# Patient Record
Sex: Male | Born: 1990 | Race: Black or African American | Hispanic: No | Marital: Single | Smoking: Never smoker
Health system: Southern US, Community
[De-identification: ages and names within clinical notes are randomized; demographics above are authoritative.]

## PROBLEM LIST (undated history)

## (undated) HISTORY — PX: ANKLE SURGERY: SHX546

---

## 2009-03-10 ENCOUNTER — Ambulatory Visit: Payer: Self-pay | Admitting: Otolaryngology

## 2014-03-07 ENCOUNTER — Ambulatory Visit: Payer: Self-pay

## 2014-03-07 ENCOUNTER — Ambulatory Visit: Payer: Self-pay | Admitting: Registered Nurse

## 2014-03-22 ENCOUNTER — Ambulatory Visit: Payer: Self-pay | Admitting: Family Medicine

## 2014-10-03 ENCOUNTER — Ambulatory Visit
Admission: EM | Admit: 2014-10-03 | Discharge: 2014-10-03 | Discharge status: Absent without leave | Payer: Managed Care, Other (non HMO)

## 2015-01-21 ENCOUNTER — Ambulatory Visit
Admission: EM | Admit: 2015-01-21 | Discharge: 2015-01-21 | Disposition: A | Discharge status: Home / Self Care | Payer: BLUE CROSS/BLUE SHIELD | Attending: Family Medicine | Admitting: Family Medicine

## 2015-01-21 ENCOUNTER — Ambulatory Visit (INDEPENDENT_AMBULATORY_CARE_PROVIDER_SITE_OTHER): Payer: BLUE CROSS/BLUE SHIELD

## 2015-01-21 ENCOUNTER — Encounter: Payer: Self-pay | Admitting: Emergency Medicine

## 2015-01-21 DIAGNOSIS — S93401A Sprain of unspecified ligament of right ankle, initial encounter: Secondary | ICD-10-CM

## 2015-01-21 DIAGNOSIS — S93601A Unspecified sprain of right foot, initial encounter: Secondary | ICD-10-CM | POA: Diagnosis not present

## 2015-01-21 MED ORDER — MELOXICAM 15 MG PO TABS: 15.0000 mg | ORAL_TABLET | Freq: Every day | ORAL | Status: DC

## 2015-01-21 NOTE — ED Notes (Signed)
Patient states that he was in a basketball game yesterday and another player landed on his right foot.  Patient c/o pain across the top of his right foot.

## 2015-01-21 NOTE — Discharge Instructions (Signed)
Foot Sprain °A foot sprain is an injury to one of the strong bands of tissue (ligaments) that connect and support the many bones in your feet. The ligament can be stretched too much or it can tear. A tear can be either partial or complete. The severity of the sprain depends on how much of the ligament was damaged or torn. °CAUSES °A foot sprain is usually caused by suddenly twisting or pivoting your foot. °RISK FACTORS °This injury is more likely to occur in people who: °· Play a sport, such as basketball or football. °· Exercise or play a sport without warming up. °· Start a new workout or sport. °· Suddenly increase how long or hard they exercise or play a sport. °SYMPTOMS °Symptoms of this condition start soon after an injury and include: °· Pain, especially in the arch of the foot. °· Bruising. °· Swelling. °· Inability to walk or use the foot to support body weight. °DIAGNOSIS °This condition is diagnosed with a medical history and physical exam. You may also have imaging tests, such as: °· X-rays to make sure there are no broken bones (fractures). °· MRI to see if the ligament has torn. °TREATMENT °Treatment varies depending on the severity of your sprain. Mild sprains can be treated with rest, ice, compression, and elevation (RICE). If your ligament is overstretched or partially torn, treatment usually involves keeping your foot in a fixed position (immobilization) for a period of time. To help you do this, your health care provider will apply a bandage, splint, or walking boot to keep your foot from moving until it heals. You may also be advised to use crutches or a scooter for a few weeks to avoid bearing weight on your foot while it is healing. °If your ligament is fully torn, you may need surgery to reconnect the ligament to the bone. After surgery, a cast or splint will be applied and will need to stay on your foot while it heals. °Your health care provider may also suggest exercises or physical therapy  to strengthen your foot. °HOME CARE INSTRUCTIONS °If You Have a Bandage, Splint, or Walking Boot: °· Wear it as directed by your health care provider. Remove it only as directed by your health care provider. °· Loosen the bandage, splint, or walking boot if your toes become numb and tingle, or if they turn cold and blue. °Bathing °· If your health care provider approves bathing and showering, cover the bandage or splint with a watertight plastic bag to protect it from water. Do not let the bandage or splint get wet. °Managing Pain, Stiffness, and Swelling  °· If directed, apply ice to the injured area: °¨ Put ice in a plastic bag. °¨ Place a towel between your skin and the bag. °¨ Leave the ice on for 20 minutes, 2-3 times per day. °· Move your toes often to avoid stiffness and to lessen swelling. °· Raise (elevate) the injured area above the level of your heart while you are sitting or lying down. °Driving °· Do not drive or operate heavy machinery while taking pain medicine. °· Do not drive while wearing a bandage, splint, or walking boot on a foot that you use for driving. °Activity °· Rest as directed by your health care provider. °· Do not use the injured foot to support your body weight until your health care provider says that you can. Use crutches or other supportive devices as directed by your health care provider. °· Ask your health care   provider what activities are safe for you. Gradually increase how much and how far you walk until your health care provider says it is safe to return to full activity.  Do any exercise or physical therapy as directed by your health care provider. General Instructions  If a splint was applied, do not put pressure on any part of it until it is fully hardened. This may take several hours.  Take medicines only as directed by your health care provider. These include over-the-counter medicines and prescription medicines.  Keep all follow-up visits as directed by your  health care provider. This is important.  When you can walk without pain, wear supportive shoes that have stiff soles. Do not wear flip-flops, and do not walk barefoot. SEEK MEDICAL CARE IF:  Your pain is not controlled with medicine.  Your bruising or swelling gets worse or does not get better with treatment.  Your splint or walking boot is damaged. SEEK IMMEDIATE MEDICAL CARE IF:  Your foot is numb or blue.  Your foot feels colder than normal.   This information is not intended to replace advice given to you by your health care provider. Make sure you discuss any questions you have with your health care provider.   Document Released: 06/23/2001 Document Revised: 05/18/2014 Document Reviewed: 11/04/2013 Elsevier Interactive Patient Education 2016 [employer]  Cryotherapy Cryotherapy is when you put ice on your injury. Ice helps lessen pain and puffiness (swelling) after an injury. Ice works the best when you start using it in the first 24 to 48 hours after an injury. HOME CARE  Put a dry or damp towel between the ice pack and your skin.  You may press gently on the ice pack.  Leave the ice on for no more than 10 to 20 minutes at a time.  Check your skin after 5 minutes to make sure your skin is okay.  Rest at least 20 minutes between ice pack uses.  Stop using ice when your skin loses feeling (numbness).  Do not use ice on someone who cannot tell you when it hurts. This includes small children and people with memory problems (dementia). GET HELP RIGHT AWAY IF:  You have white spots on your skin.  Your skin turns blue or pale.  Your skin feels waxy or hard.  Your puffiness gets worse. MAKE SURE YOU:   Understand these instructions.  Will watch your condition.  Will get help right away if you are not doing well or get worse.   This information is not intended to replace advice given to you by your health care provider. Make sure you discuss any questions you  have with your health care provider.   Document Released: 06/20/2007 Document Revised: 03/26/2011 Document Reviewed: 08/24/2010 Elsevier Interactive Patient Education 2016 [employer] Inc[employer]  Ankle Sprain An ankle sprain is an injury to the strong, fibrous tissues (ligaments) that hold your ankle bones together.  HOME CARE   Put ice on your ankle for 1-2 days or as told by your doctor.  Put ice in a plastic bag.  Place a towel between your skin and the bag.  Leave the ice on for 15-20 minutes at a time, every 2 hours while you are awake.  Only take medicine as told by your doctor.  Raise (elevate) your injured ankle above the level of your heart as much as possible for 2-3 days.  Use crutches if your doctor tells you to. Slowly put your own weight on the affected ankle.  Use the crutches until you can walk without pain.  If you have a plaster splint:  Do not rest it on anything harder than a pillow for 24 hours.  Do not put weight on it.  Do not get it wet.  Take it off to shower or bathe.  If given, use an elastic wrap or support stocking for support. Take the wrap off if your toes lose feeling (numb), tingle, or turn cold or blue.  If you have an air splint:  Add or let out air to make it comfortable.  Take it off at night and to shower and bathe.  Wiggle your toes and move your ankle up and down often while you are wearing it. GET HELP IF:  You have rapidly increasing bruising or puffiness (swelling).  Your toes feel very cold.  You lose feeling in your foot.  Your medicine does not help your pain. GET HELP RIGHT AWAY IF:   Your toes lose feeling (numb) or turn blue.  You have severe pain that is increasing. MAKE SURE YOU:   Understand these instructions.  Will watch your condition.  Will get help right away if you are not doing well or get worse.   This information is not intended to replace advice given to you by your health care provider. Make sure you  discuss any questions you have with your health care provider.   Document Released: 06/20/2007 Document Revised: 01/22/2014 Document Reviewed: 07/16/2011 Elsevier Interactive Patient Education [employer]

## 2015-01-21 NOTE — ED Provider Notes (Signed)
CSN: [id number]     Arrival date & time 01/21/15  0848 History   First MD Initiated Contact with Patient 01/21/15 [phone]    Nurses notes were reviewed. Chief Complaint  Patient presents with  . Foot Pain   She was playing basketball last night when he twists his ankle with down. States it was not too bad last night he was able to walk on it but this morning swelling got worse and he has been unable to walk on it without excruciating pain. He is using crutches at this time. He's had previous injury to the right ankle and college when he is playing football and was hit outbound and wound up having a fracture of his ankle with subsequent pins placed.  Does not smoke no other significant medical surgical history. No significant family medical history  (Consider location/radiation/quality/duration/timing/severity/associated sxs/prior Treatment) Patient is a 25 y.o. male presenting with foot injury. The history is provided by the patient. No language interpreter was used.  Foot Injury Location:  Foot and ankle Time since incident:  1 day Injury: yes   Mechanism of injury: fall   Fall:    Fall occurred:  Recreating/playing   Impact surface:  Athletic surface and playground equipment   Entrapped after fall: no   Ankle location:  R ankle Foot location:  R foot Pain details:    Quality:  Aching, sharp and throbbing   Radiates to:  Does not radiate   Severity:  Moderate   Onset quality:  Sudden   Duration:  1 day   Timing:  Constant   Progression:  Unchanged Chronicity:  New Foreign body present:  No foreign bodies Relieved by:  Nothing Ineffective treatments:  None tried Associated symptoms: stiffness and swelling   Risk factors: no concern for non-accidental trauma, no frequent fractures, no known bone disorder, no obesity and no recent illness     History reviewed. No pertinent past medical history. Past Surgical History  Procedure Laterality Date  . Ankle surgery Right    History  reviewed. No pertinent family history. Social History  Substance Use Topics  . Smoking status: Never Smoker   . Smokeless tobacco: None  . Alcohol Use: No    Review of Systems  Musculoskeletal: Positive for myalgias, joint swelling, gait problem and stiffness.  All other systems reviewed and are negative.   Allergies  Review of patient's allergies indicates no known allergies.  Home Medications   Prior to Admission medications   Medication Sig Start Date End Date Taking? Authorizing Provider  meloxicam (MOBIC) 15 MG tablet Take 1 tablet (15 mg total) by mouth daily. 01/21/15   Hassan Rowan, MD   Meds Ordered and Administered this Visit  Medications - No data to display  BP 122/74 mmHg  Pulse 72  Temp(Src) 97.2 F (36.2 C) (Tympanic)  Resp 16  Ht 6' (1.829 m)  Wt 207 lb (93.895 kg)  BMI 28.07 kg/m2  SpO2 100% No data found.   Physical Exam  Constitutional: He appears well-developed and well-nourished.  HENT:  Head: Normocephalic and atraumatic.  Eyes: Conjunctivae are normal. Pupils are equal, round, and reactive to light.  Musculoskeletal: He exhibits edema and tenderness.       Right ankle: He exhibits swelling. Tenderness.       Right foot: There is tenderness, bony tenderness and swelling.       Feet:  Neurological: He is alert.  Skin: Skin is warm.  Psychiatric: He has a normal mood and affect.  Vitals reviewed.   ED Course  Procedures (including critical care time)  Labs Review Labs Reviewed - No data to display  Imaging Review Dg Foot Complete Right  01/21/2015  CLINICAL DATA:  25 year old male injured right foot in basketball game last night. Pain over the dorsal tarsal bones. Initial encounter. EXAM: RIGHT FOOT COMPLETE - 3+ VIEW COMPARISON:  None. FINDINGS: Cannulated screws traverse the distal right tibia at the level of the plafond and and appear intact. Bone mineralization is within normal limits. Degenerative spurring at the anterior talus and  talonavicular articulation. Overlying soft tissue swelling and stranding in that region. No acute fracture or dislocation identified. Tarsal bone alignment within normal limits. Metatarsals, phalanges, and calcaneus appear intact. IMPRESSION: 1. No acute fracture or dislocation identified about the right foot. 2. Soft tissue swelling overlying degenerative spurring at the talonavicular joint. 3. Distal tibia hardware with no adverse features. Electronically Signed   By: Odessa FlemingH  Hall M.D.   On: 01/21/2015 09:38     Visual Acuity Review  Right Eye Distance:   Left Eye Distance:   Bilateral Distance:    Right Eye Near:   Left Eye Near:    Bilateral Near:         MDM   1. Right ankle sprain, initial encounter   2. Right foot sprain, initial encounter    Patient be placed in a walking boot since x-rays were negative return for follow-up here PCP in 2 weeks if needed. Placed on Mobic 15 mg 1 tablet a day as well    Hassan RowanEugene Lucianne Smestad, MD 01/21/15 1000

## 2016-02-13 ENCOUNTER — Encounter: Payer: Self-pay | Admitting: Emergency Medicine

## 2016-02-13 ENCOUNTER — Emergency Department
Admission: EM | Admit: 2016-02-13 | Discharge: 2016-02-13 | Disposition: A | Discharge status: Home / Self Care | Payer: BLUE CROSS/BLUE SHIELD | Attending: Emergency Medicine | Admitting: Emergency Medicine

## 2016-02-13 DIAGNOSIS — Y929 Unspecified place or not applicable: Secondary | ICD-10-CM | POA: Diagnosis not present

## 2016-02-13 DIAGNOSIS — S0101XA Laceration without foreign body of scalp, initial encounter: Secondary | ICD-10-CM | POA: Insufficient documentation

## 2016-02-13 DIAGNOSIS — S0990XA Unspecified injury of head, initial encounter: Secondary | ICD-10-CM

## 2016-02-13 DIAGNOSIS — W500XXA Accidental hit or strike by another person, initial encounter: Secondary | ICD-10-CM | POA: Diagnosis not present

## 2016-02-13 DIAGNOSIS — Y9367 Activity, basketball: Secondary | ICD-10-CM | POA: Insufficient documentation

## 2016-02-13 DIAGNOSIS — Y999 Unspecified external cause status: Secondary | ICD-10-CM | POA: Insufficient documentation

## 2016-02-13 NOTE — ED Provider Notes (Signed)
[facility] Emergency Department Provider Note  ____________________________________________  Time seen: Approximately 11:27 PM  I have reviewed the triage vital signs and the nursing notes.   HISTORY  Chief Complaint Head Injury and Headache    HPI Maurice Martin is a 26 y.o. male resist emergency department for complaint of head injury. Patient states he was playing basketball when he was accidentally hit in the head by another player's elbow. Patient did not have any loss of consciousness at the time of event or since time and event. Patient does report a small laceration to the left temporal region. Patient reports that he has had a concussion in high school. He denies any visual changes or neck pain. He denies any infusion. His father presented to the emergency department with him and reports that patient has been acting completely normal. Patient does endorse a mild left-sided headache. He has not taken any medications prior to arrival. Patient's last tetanus shot was approximately 8 months prior. No other injury or complaint at this time.   History reviewed. No pertinent past medical history.  There are no active problems to display for this patient.   Past Surgical History:  Procedure Laterality Date  . ANKLE SURGERY Right     Prior to Admission medications   Not on File    Allergies Sulfa antibiotics  History reviewed. No pertinent family history.  Social History Social History  Substance Use Topics  . Smoking status: Never Smoker  . Smokeless tobacco: Never Used  . Alcohol use No     Review of Systems  Constitutional: No fever/chills Eyes: No visual changes. Cardiovascular: no chest pain. Respiratory: no cough. No SOB. Gastrointestinal: No abdominal pain.  No nausea, no vomiting.   Musculoskeletal: Negative for musculoskeletal pain. Skin: Positive for laceration to the left side of the scalp. Neurological: For headache  but denies focal weakness or numbness. 10-point ROS otherwise negative.  ____________________________________________   PHYSICAL EXAM:  VITAL SIGNS: ED Triage Vitals  Enc Vitals Group     BP 02/13/16 2200 138/88     Pulse Rate 02/13/16 2200 76     Resp 02/13/16 2200 16     Temp 02/13/16 2200 98.4 F (36.9 C)     Temp Source 02/13/16 2200 Oral     SpO2 02/13/16 2200 99 %     Weight 02/13/16 2201 219 lb (99.3 kg)     Height 02/13/16 2201 6' (1.829 m)     Head Circumference --      Peak Flow --      Pain Score 02/13/16 2201 5     Pain Loc --      Pain Edu? --      Excl. in GC? --      Constitutional: Alert and oriented. Well appearing and in no acute distress. Eyes: Conjunctivae are normal. PERRL. EOMI. Head: Small, superficial, 1 cm laceration noted to left temporal region. Congealed blood is noted. No wound separation. No active bleeding. No foreign body. Area is only mildly tender to palpation. No palpable abnormality. No raccoon eyes. No battle signs. The serosanguineous fluid drainage from the ears or nares. ENT:      Ears:       Nose: No congestion/rhinnorhea.      Mouth/Throat: Mucous membranes are moist.  Neck: No stridor.  No cervical spine tenderness to palpation.  Cardiovascular: Normal rate, regular rhythm. Normal S1 and S2.  Good peripheral circulation. Respiratory: Normal respiratory effort without tachypnea or retractions.  Lungs CTAB. Good air entry to the bases with no decreased or absent breath sounds. Musculoskeletal: Full range of motion to all extremities. No gross deformities appreciated. Neurologic:  Normal speech and language. No gross focal neurologic deficits are appreciated. Cranial nerves II through XII grossly intact. Skin:  Skin is warm, dry and intact. No rash noted. Psychiatric: Mood and affect are normal. Speech and behavior are normal. Patient exhibits appropriate insight and judgement.   ____________________________________________    LABS (all labs ordered are listed, but only abnormal results are displayed)  Labs Reviewed - No data to display ____________________________________________  EKG   ____________________________________________  RADIOLOGY   No results found.  ____________________________________________    PROCEDURES  Procedure(s) performed:    Procedures    Medications - No data to display   ____________________________________________   INITIAL IMPRESSION / ASSESSMENT AND PLAN / ED COURSE  Pertinent labs & imaging results that were available during my care of the patient were reviewed by me and considered in my medical decision making (see chart for details).  Review of the [place] CSRS was performed in accordance of the [facility] prior to dispensing any controlled drugs.     Patient's diagnosis is consistent with minor head injury with scalp laceration. Patient has no signs of concussion and concussion and neuro exam was completely unremarkable. Patient does have a small superficial laceration to the left side of scalp. After discussing with patient, no treatment is administered. This is shallow and will heal on its own. In my opinion, staples are unnecessary. Patient opts for no staples.. No indication for imaging at this time. Patient take Tylenol and Motrin as needed at home. Wound care instructions are given to patient. Patient will follow-up with primary care as needed. Patient is given ED precautions to return to the ED for any worsening or new symptoms.     ____________________________________________  FINAL CLINICAL IMPRESSION(S) / ED DIAGNOSES  Final diagnoses:  Minor head injury, initial encounter  Scalp laceration, initial encounter      NEW MEDICATIONS STARTED DURING THIS VISIT:  There are no discharge medications for this patient.       This chart was dictated using voice recognition software/Dragon. Despite best efforts to proofread, errors can occur which  can change the meaning. Any change was purely unintentional.    Racheal Patches, PA-C 02/14/16 [id number]    Sharyn Creamer, MD 02/17/16 [phone]

## 2016-02-13 NOTE — ED Triage Notes (Signed)
Pt arrived to the ED accompanied by his father for complaints of headache secondary to a head injury. Pt reports that he was elbowed in the head while playing basket ball. Pt denies LOC and any other accompanied symptoms. Pt is AOx4 in no apparent distress.

## 2016-02-13 NOTE — ED Notes (Signed)

## 2017-10-25 ENCOUNTER — Encounter: Payer: Self-pay | Admitting: Emergency Medicine

## 2017-10-25 ENCOUNTER — Other Ambulatory Visit: Payer: Self-pay

## 2017-10-25 ENCOUNTER — Emergency Department: Payer: No Typology Code available for payment source

## 2017-10-25 ENCOUNTER — Emergency Department
Admission: EM | Admit: 2017-10-25 | Discharge: 2017-10-25 | Disposition: A | Discharge status: Home / Self Care | Payer: No Typology Code available for payment source | Attending: Emergency Medicine | Admitting: Emergency Medicine

## 2017-10-25 DIAGNOSIS — S63502A Unspecified sprain of left wrist, initial encounter: Secondary | ICD-10-CM | POA: Diagnosis not present

## 2017-10-25 DIAGNOSIS — Y929 Unspecified place or not applicable: Secondary | ICD-10-CM | POA: Insufficient documentation

## 2017-10-25 DIAGNOSIS — Y990 Civilian activity done for income or pay: Secondary | ICD-10-CM | POA: Diagnosis not present

## 2017-10-25 DIAGNOSIS — Y9389 Activity, other specified: Secondary | ICD-10-CM | POA: Insufficient documentation

## 2017-10-25 DIAGNOSIS — X500XXA Overexertion from strenuous movement or load, initial encounter: Secondary | ICD-10-CM | POA: Diagnosis not present

## 2017-10-25 DIAGNOSIS — S6992XA Unspecified injury of left wrist, hand and finger(s), initial encounter: Secondary | ICD-10-CM | POA: Diagnosis present

## 2017-10-25 MED ORDER — IBUPROFEN 800 MG PO TABS
800.0000 mg | ORAL_TABLET | Freq: Once | ORAL | Status: AC
  Administered 2017-10-25: 800 mg via ORAL
  Filled 2017-10-25: qty 1

## 2017-10-25 NOTE — ED Notes (Signed)
Patient reports that approx 0100 today patient accidentally dropped 60lb object on left wrist.

## 2017-10-25 NOTE — ED Triage Notes (Signed)
Patient to ER for c/o left wrist injury that occurred while at work. Patient states he picked up something heavy and felt pain. Denies hearing any popping. No obvious deformity noted.

## 2017-10-25 NOTE — ED Notes (Signed)
Xray tech at bedside.

## 2017-10-25 NOTE — ED Provider Notes (Signed)
[facility] Emergency Department Provider Note   ____________________________________________   First MD Initiated Contact with Patient 10/25/17 [phone]     (approximate)  I have reviewed the triage vital signs and the nursing notes.   HISTORY  Chief Complaint Wrist Injury    HPI Maurice Martin is a 27 y.o. male who presents to the ED from work with a chief complaint of left wrist injury.  Patient states he picked up something heavy which bent his wrist backwards and he felt pain.  Denies falling or crush injury.  Patient is right-hand dominant.  Complains of pain to his medial wrist without weakness, numbness or tingling.  Voices no other injuries or complaints.   Past medical history None  There are no active problems to display for this patient.   Past Surgical History:  Procedure Laterality Date  . ANKLE SURGERY Right     Prior to Admission medications   Not on File    Allergies Sulfa antibiotics  No family history on file.  Social History Social History   Tobacco Use  . Smoking status: Never Smoker  . Smokeless tobacco: Never Used  Substance Use Topics  . Alcohol use: No  . Drug use: No    Review of Systems  Constitutional: No fever/chills Eyes: No visual changes. ENT: No sore throat. Cardiovascular: Denies chest pain. Respiratory: Denies shortness of breath. Gastrointestinal: No abdominal pain.  No nausea, no vomiting.  No diarrhea.  No constipation. Genitourinary: Negative for dysuria. Musculoskeletal: Positive for left wrist injury and pain.  Negative for back pain. Skin: Negative for rash. Neurological: Negative for headaches, focal weakness or numbness.   ____________________________________________   PHYSICAL EXAM:  VITAL SIGNS: ED Triage Vitals  Enc Vitals Group     BP 10/25/17 0230 140/65     Pulse Rate 10/25/17 0230 82     Resp 10/25/17 0230 18     Temp 10/25/17 0230 98.2 F (36.8 C)     Temp  Source 10/25/17 0230 Oral     SpO2 10/25/17 0230 100 %     Weight 10/25/17 0231 225 lb (102.1 kg)     Height 10/25/17 0231 6' (1.829 m)     Head Circumference --      Peak Flow --      Pain Score 10/25/17 0231 5     Pain Loc --      Pain Edu? --      Excl. in GC? --     Constitutional: Alert and oriented. Well appearing and in no acute distress. Eyes: Conjunctivae are normal. PERRL. EOMI. Head: Atraumatic. Nose: Atraumatic. Mouth/Throat: Mucous membranes are moist.  No dental malocclusion. Neck: No stridor.  No cervical spine tenderness to palpation. Cardiovascular: Normal rate, regular rhythm. Grossly normal heart sounds.  Good peripheral circulation. Respiratory: Normal respiratory effort.  No retractions. Lungs CTAB. Gastrointestinal: Soft and nontender. No distention. No abdominal bruits. No CVA tenderness. Musculoskeletal:  Left volar wrist tender to palpation near ulnar styloid.  Full range of motion with mild pain.  2+ radial pulses.  Brisk, less than 5-second capillary refill. No lower extremity tenderness nor edema.  No joint effusions. Neurologic:  Normal speech and language. No gross focal neurologic deficits are appreciated. No gait instability. Skin:  Skin is warm, dry and intact. No rash noted. Psychiatric: Mood and affect are normal. Speech and behavior are normal.  ____________________________________________   LABS (all labs ordered are listed, but only abnormal results are displayed)  Labs  Reviewed - No data to display ____________________________________________  EKG  None ____________________________________________  RADIOLOGY  ED MD interpretation: No fracture or dislocation  Official radiology report(s): Dg Wrist Complete Left  Result Date: 10/25/2017 CLINICAL DATA:  Left anterior wrist injury at work. EXAM: LEFT WRIST - COMPLETE 3+ VIEW COMPARISON:  None. FINDINGS: There is no evidence of fracture or dislocation. There is no evidence of  arthropathy or other focal bone abnormality. Soft tissues are unremarkable. IMPRESSION: Negative. Electronically Signed   By: Burman Nieves M.D.   On: 10/25/2017 03:41    ____________________________________________   PROCEDURES  Procedure(s) performed: None  Procedures  Critical Care performed: No  ____________________________________________   INITIAL IMPRESSION / ASSESSMENT AND PLAN / ED COURSE  As part of my medical decision making, I reviewed the following data within the electronic MEDICAL RECORD NUMBER Nursing notes reviewed and incorporated, Radiograph reviewed and Notes from prior ED visits   27 year old male who presents with left wrist injury occurred at work.  Will obtain imaging study, administer NSAID and reassess.  Clinical Course as of Oct 25 344  Fri Oct 25, 2017  1610 Updated patient on negative x-ray result.  Will place in Velcro wrist splint and patient will follow-up with orthopedics as needed.  Strict return precautions given.  Patient verbalizes understanding and agrees with plan of care.   [JS]    Clinical Course User Index [JS] Irean Hong, MD     ____________________________________________   FINAL CLINICAL IMPRESSION(S) / ED DIAGNOSES  Final diagnoses:  Wrist sprain, left, initial encounter     ED Discharge Orders    None       Note:  This document was prepared using Dragon voice recognition software and may include unintentional dictation errors.    Irean Hong, MD 10/25/17 [phone]

## 2017-10-25 NOTE — ED Notes (Signed)
workmans comp breath analysis done by [employer].  Workmans comp UDS completed and sample walked to lab.  Pt did not have Govt issued photo ID on his person.  Pts supervisor, Inda Merlin, accompanied him and verified his identity.  Pt given paperwork for both tests and ambulated to room 15.

## 2017-10-25 NOTE — Discharge Instructions (Addendum)
1.  You may take Tylenol and/or ibuprofen as needed for pain. 2.  Wear Velcro splint as needed for comfort. 3.  Apply ice over splint several times daily. 4.  Return to the ER for worsening symptoms, persistent vomiting, difficulty breathing or other concerns.

## 2017-10-25 NOTE — ED Notes (Signed)
This RN reviewed discharge instructions, follow-up care, OTC pain medications, splint use, cryotherapy, and need for elevation with patient. Patient verbalized understanding of all reviewed information.  Patient stable, with no distress noted at this time.

## 2018-05-04 ENCOUNTER — Emergency Department (HOSPITAL_BASED_OUTPATIENT_CLINIC_OR_DEPARTMENT_OTHER)
Admission: EM | Admit: 2018-05-04 | Discharge: 2018-05-04 | Disposition: A | Discharge status: Home / Self Care | Payer: No Typology Code available for payment source | Attending: Emergency Medicine | Admitting: Emergency Medicine

## 2018-05-04 ENCOUNTER — Other Ambulatory Visit: Payer: Self-pay

## 2018-05-04 DIAGNOSIS — R51 Headache: Secondary | ICD-10-CM | POA: Diagnosis present

## 2018-05-04 DIAGNOSIS — G44309 Post-traumatic headache, unspecified, not intractable: Secondary | ICD-10-CM | POA: Diagnosis not present

## 2018-05-04 MED ORDER — IBUPROFEN 600 MG PO TABS: 600.0000 mg | ORAL_TABLET | Freq: Four times a day (QID) | ORAL | 0 refills | Status: AC | PRN

## 2018-05-04 MED ORDER — KETOROLAC TROMETHAMINE 60 MG/2ML IM SOLN
60.0000 mg | Freq: Once | INTRAMUSCULAR | Status: AC
  Administered 2018-05-04: 60 mg via INTRAMUSCULAR
  Filled 2018-05-04: qty 2
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
## 2018-05-04 NOTE — Discharge Instructions (Addendum)
As we discussed, I would not return to work until headache is gone for 24 hours  No heavy lifting or exercise for 7 days, then gradually return to normal activity

## 2018-05-04 NOTE — ED Notes (Signed)
ED Provider at bedside. 
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
## 2018-05-07 ENCOUNTER — Encounter (HOSPITAL_BASED_OUTPATIENT_CLINIC_OR_DEPARTMENT_OTHER): Payer: Self-pay | Admitting: Emergency Medicine

## 2018-05-07 ENCOUNTER — Emergency Department (HOSPITAL_BASED_OUTPATIENT_CLINIC_OR_DEPARTMENT_OTHER)
Admission: EM | Admit: 2018-05-07 | Discharge: 2018-05-07 | Disposition: A | Discharge status: Home / Self Care | Payer: No Typology Code available for payment source | Attending: Emergency Medicine | Admitting: Emergency Medicine

## 2018-05-07 ENCOUNTER — Other Ambulatory Visit: Payer: Self-pay

## 2018-05-07 DIAGNOSIS — F0781 Postconcussional syndrome: Secondary | ICD-10-CM

## 2018-05-07 DIAGNOSIS — R51 Headache: Secondary | ICD-10-CM | POA: Diagnosis not present
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
## 2018-05-07 NOTE — ED Triage Notes (Signed)
Pt co recurrent headache post work related injury, was seen 2 days ago for the injury. Reports went back to work today , reports headache and noise at work  bothers him. Alert and oriented x 4. Ambulated to room with steady gait.

## 2018-08-21 ENCOUNTER — Other Ambulatory Visit: Payer: Self-pay

## 2018-08-21 ENCOUNTER — Ambulatory Visit
Admission: EM | Admit: 2018-08-21 | Discharge: 2018-08-21 | Disposition: A | Discharge status: Home Health | Payer: Managed Care, Other (non HMO) | Attending: Urgent Care | Admitting: Urgent Care

## 2018-08-21 DIAGNOSIS — Z7189 Other specified counseling: Secondary | ICD-10-CM | POA: Diagnosis not present

## 2018-08-21 DIAGNOSIS — J029 Acute pharyngitis, unspecified: Secondary | ICD-10-CM

## 2018-08-21 LAB — RAPID STREP SCREEN (MED CTR MEBANE ONLY): Streptococcus, Group A Screen (Direct): NEGATIVE

## 2018-08-21 NOTE — ED Provider Notes (Signed)
[place],    Name: Maurice Martin DOB: 17-Apr-1990 MRN: [id number] CSN: [id number] PCP: Patient, No Pcp Per  Arrival date and time:  08/21/18 1021  Chief Complaint:  Sore Throat   NOTE: Prior to seeing the patient today, I have reviewed the triage nursing documentation and vital signs. Clinical staff has updated patient's PMH/PSHx, current medication list, and drug allergies/intolerances to ensure comprehensive history available to assist in medical decision making.   History:   HPI: Maurice Martin is a 28 y.o. male who presents today with complaints of a sore throat that began yesterday. Patient denies any associated fevers, cough, shortness of breath, or otalgia. He has not experienced any difficulties swallowing. Patient is employed by [employer]. He was sent home from his job due to his symptoms. Patient is unsure whether or not anyone at work has tested positive for SARS-CoV-2 (novel coronavirus); he has never been tested. Patient presents today advising that he is being required to provide documentation of negative test results before he will be allowed to return to work.   History reviewed. No pertinent past medical history.  Past Surgical History:  Procedure Laterality Date  . ANKLE SURGERY Right     Family History  Problem Relation Age of Onset  . Healthy Mother   . Healthy Father     Social History   Tobacco Use  . Smoking status: Never Smoker  . Smokeless tobacco: Never Used  Substance Use Topics  . Alcohol use: No  . Drug use: No    There are no active problems to display for this patient.   Home Medications:    No outpatient medications have been marked as taking for the 08/21/18 encounter [facility](Hospital Encounter).    Allergies:   Sulfa antibiotics  Review of Systems (ROS): Review of Systems  Constitutional: Negative for fatigue and fever.  HENT: Positive for postnasal drip and sore throat. Negative for congestion,  ear pain, rhinorrhea, sinus pressure, sinus pain and sneezing.   Eyes: Negative for pain, discharge and redness.  Respiratory: Negative for cough, chest tightness and shortness of breath.   Cardiovascular: Negative for chest pain and palpitations.  Gastrointestinal: Negative for abdominal pain, diarrhea, nausea and vomiting.  Musculoskeletal: Negative for arthralgias, back pain, myalgias and neck pain.  Skin: Negative for color change, pallor and rash.  Neurological: Negative for dizziness, syncope, weakness and headaches.  Hematological: Negative for adenopathy.     Vital Signs: Today's Vitals   08/21/18 1054 08/21/18 1056 08/21/18 1140  BP:  124/81   Pulse:  65   Resp:  18   Temp:  98.5 F (36.9 C)   TempSrc:  Oral   SpO2:  100%   Weight: 226 lb (102.5 kg)    Height: 5\' 11"  (1.803 m)    PainSc: 5   5     Physical Exam: Physical Exam  Constitutional: He is oriented to person, place, and time and well-developed, well-nourished, and in no distress.  HENT:  Head: Normocephalic and atraumatic.  Right Ear: Hearing, tympanic membrane, external ear and ear canal normal.  Left Ear: Hearing, tympanic membrane, external ear and ear canal normal.  Nose: Nose normal.  Mouth/Throat: Uvula is midline and mucous membranes are normal. Posterior oropharyngeal erythema present. No oropharyngeal exudate or posterior oropharyngeal edema.  Cardiovascular: Normal rate, regular rhythm, normal heart sounds and intact distal pulses. Exam reveals no gallop and no friction rub.  No murmur heard. Pulmonary/Chest: Effort normal and breath sounds normal.  No respiratory distress. He has no wheezes. He has no rales.  Neurological: He is alert and oriented to person, place, and time. Gait normal. GCS score is 15.  Skin: Skin is warm and dry. No rash noted.  Psychiatric: Mood, memory, affect and judgment normal.  Nursing note and vitals reviewed.   Urgent Care Treatments / Results:   LABS: PLEASE NOTE:  all labs that were ordered this encounter are listed, however only abnormal results are displayed. Labs Reviewed  RAPID STREP SCREEN ([facility] [place] ONLY)  CULTURE, GROUP A STREP ([facility])  NOVEL CORONAVIRUS, NAA (HOSPITAL ORDER, SEND-OUT TO REF LAB)    EKG: -None  RADIOLOGY: No results found.  PROCEDURES: Procedures  MEDICATIONS RECEIVED THIS VISIT: Medications - No data to display  PERTINENT CLINICAL COURSE NOTES/UPDATES:   Initial Impression / Assessment and Plan / Urgent Care Course:  Pertinent labs & imaging results that were available during my care of the patient were personally reviewed by me and considered in my medical decision making (see lab/imaging section of note for values and interpretations).  Maurice Martin is a 28 y.o. male who [facility] today with complaints of Sore Throat   Patient is well appearing overall in clinic today. He does not appear to be in any acute distress. Presenting symptoms (see HPI) and exam as documented above. Rapid strep swab negative; reflex culture sent. Patient has mild clear PND, which is likely the cause of his sore throat. Posterior oropharynx is only mildly erythematous. Other potential causes include viral etiology. Recommended supportive care at home with warm salt water gargles, lozenges/hard candies, and APAP and/or IBU as needed for discomfort.   Patient presents today requesting SARS-CoV-2 (novel coronavirus) testing. He is unable to advise whether or not he has been exposed, however given his sore throat, his job is requiring negative results prior to allowing him to RTW. Discussed typical symptom constellation. Testing is felt to be reasonable today. Patient collected SARS-CoV-2 via facility approved self swab process today under the supervision of certified clinical staff. Discussed variable turn around times associated with testing, as swabs are being processed at [facility], and have been taking as long as  7 days. He was advised to self quarantine, per [facility] DHHS guidelines, until negative results received.   Current clinical condition warrants patient being out of work in order to recover from his current injury/illness. He was provided with the appropriate documentation to provide to his place of employment that will allow for him to RTW on 08/23/2018 with no restrictions given that his SARS-CoV-2 test has resulted as negative.   Discussed follow up with primary care physician in 1 week for re-evaluation. I have reviewed the follow up and strict return precautions for any new or worsening symptoms. Patient is aware of symptoms that would be deemed urgent/emergent, and would thus require further evaluation either here or in the emergency department. At the time of discharge, he verbalized understanding and consent with the discharge plan as it was reviewed with him. All questions were fielded by provider and/or clinic staff prior to patient discharge.    Final Clinical Impressions / Urgent Care Diagnoses:   Final diagnoses:  Sore throat  Advice Given About Covid-19 Virus Infection    New Prescriptions:  [place] Controlled Substance Registry consulted? Not Applicable  No orders of the defined types were placed in this encounter.   Recommended Follow up Care:  Patient encouraged to follow up with the following provider within the  specified time frame, or sooner as dictated by the severity of his symptoms. As always, he was instructed that for any urgent/emergent care needs, he should seek care either here or in the emergency department for more immediate evaluation.  Follow-up Information    PCP In 1 week.   Why: General reassessment of symptoms if not improving        NOTE: This note was prepared using Lobbyist along with smaller Company secretary. Despite my best ability to proofread, there is the potential that transcriptional errors may still occur from this process, and are  completely unintentional.    Karen Kitchens, NP 08/22/18 [phone]

## 2018-08-21 NOTE — ED Triage Notes (Signed)
Pt here for sore throat since Wednesday and was told by his job he must come here and get a note. Hurts when he swallows and uncomfortable mainly all the time. No other symptoms reported.

## 2018-08-21 NOTE — Discharge Instructions (Addendum)
It was very nice seeing you today in clinic. Thank you for entrusting me with your care.   Your strep testing was negative. I think that it is related to some mind sinus drainage. Try warm salt water gargles.   You have been tested for SARS-CoV-2 (novel coronavirus) today. Testing results have been taking between 3 and 7 days. Please self quarantine, per [facility] DHHS guidelines, until you have received negative test results.   If you develop any  worsening symptoms or concerns, make arrangements to follow up with your regular doctor. If your symptoms are severe, please seek follow up care in the ER. Please remember, our [facility] providers are "right here with you" when you need [place].   Again, it was my pleasure to take care of you today. Thank you for choosing our clinic. I hope that you start to feel better quickly.   Honor Loh, MSN, APRN, FNP-C, CEN Advanced Practice Provider [facility]

## 2018-08-22 LAB — NOVEL CORONAVIRUS, NAA (HOSP ORDER, SEND-OUT TO REF LAB; TAT 18-24 HRS): SARS-CoV-2, NAA: NOT DETECTED

## 2018-08-24 LAB — CULTURE, GROUP A STREP (THRC)

## 2019-07-02 ENCOUNTER — Ambulatory Visit: Payer: Self-pay | Admitting: Physician Assistant

## 2019-07-02 ENCOUNTER — Other Ambulatory Visit: Payer: Self-pay

## 2019-07-02 DIAGNOSIS — Z202 Contact with and (suspected) exposure to infections with a predominantly sexual mode of transmission: Secondary | ICD-10-CM

## 2019-07-02 DIAGNOSIS — Z113 Encounter for screening for infections with a predominantly sexual mode of transmission: Secondary | ICD-10-CM

## 2019-07-02 LAB — GRAM STAIN

## 2019-07-02 MED ORDER — AZITHROMYCIN 500 MG PO TABS
1000.0000 mg | ORAL_TABLET | Freq: Once | ORAL | Status: AC
  Administered 2019-07-02: 1000 mg via ORAL

## 2019-07-03 ENCOUNTER — Encounter: Payer: Self-pay | Admitting: Physician Assistant

## 2019-07-03 NOTE — Progress Notes (Signed)
   [facility] Department STI clinic/screening visit  Subjective:  Jarid Sasso is a 29 y.o. male being seen today for an STI screening visit. The patient reports they do not have symptoms.    Patient has the following medical conditions:  There are no problems to display for this patient.    Chief Complaint  Patient presents with  . SEXUALLY TRANSMITTED DISEASE    screening    HPI  Patient reports that he is not having any symptoms but is a contact to Chlamydia.  Denies chronic conditions and regular medicines.  Reports surgery on R ankle and last HIV test 09/2018.   See flowsheet for further details and programmatic requirements.    The following portions of the patient's history were reviewed and updated as appropriate: allergies, current medications, past medical history, past social history, past surgical history and problem list.  Objective:  There were no vitals filed for this visit.  Physical Exam Constitutional:      General: He is not in acute distress.    Appearance: Normal appearance.  HENT:     Head: Normocephalic and atraumatic.     Comments: No nits, lice, or hair loss. No cervical, supraclavicular or axillary adenopathy.    Mouth/Throat:     Mouth: Mucous membranes are moist.     Pharynx: Oropharynx is clear. No oropharyngeal exudate or posterior oropharyngeal erythema.  Eyes:     Conjunctiva/sclera: Conjunctivae normal.  Pulmonary:     Effort: Pulmonary effort is normal.  Abdominal:     Palpations: Abdomen is soft. There is no mass.     Tenderness: There is no abdominal tenderness. There is no guarding or rebound.  Genitourinary:    Penis: Normal.      Testes: Normal.     Comments: Pubic area without nits, lice, edema, erythema, lesions and inguinal adenopathy. Penis circumcised, without rash, lesions and discharge at meatus. Musculoskeletal:     Cervical back: Neck supple. No tenderness.  Skin:    General: Skin is warm and  dry.     Findings: No bruising, erythema, lesion or rash.  Neurological:     Mental Status: He is alert and oriented to person, place, and time.  Psychiatric:        Mood and Affect: Mood normal.        Behavior: Behavior normal.        Thought Content: Thought content normal.        Judgment: Judgment normal.       Assessment and Plan:  Vasilios Ottaway is a 29 y.o. male presenting to the [facility] Department for STI screening  1. Screening for STD (sexually transmitted disease) Patient into clinic without symptoms. Rec condoms with all sex. Await test results.  Counseled that RN will call if needs to RTC for further treatment once results are back.  - Gram stain - Gonococcus culture - HIV [place] [facility] - Syphilis Serology, [facility]  2. Chlamydia contact Will treat as a contact to Chlamydia with Azithromycin 1 g po DOT today. No sex for 7 days and until after partner completes treatment. RTC for re-treatment if vomits < 2 hr after taking medicine. - azithromycin (ZITHROMAX) tablet 1,000 mg     No follow-ups on file.  No future appointments.  Matt Holmes, PA

## 2019-07-07 LAB — GONOCOCCUS CULTURE

## 2020-08-19 IMAGING — DX DG WRIST COMPLETE 3+V*L*
4 series · 4 of 4 positions shown · non-contrast
Comparison: None.

CLINICAL DATA: Left anterior wrist injury at work.

EXAM:
LEFT WRIST - COMPLETE 3+ VIEW

[wrist ap (1 of 2)]
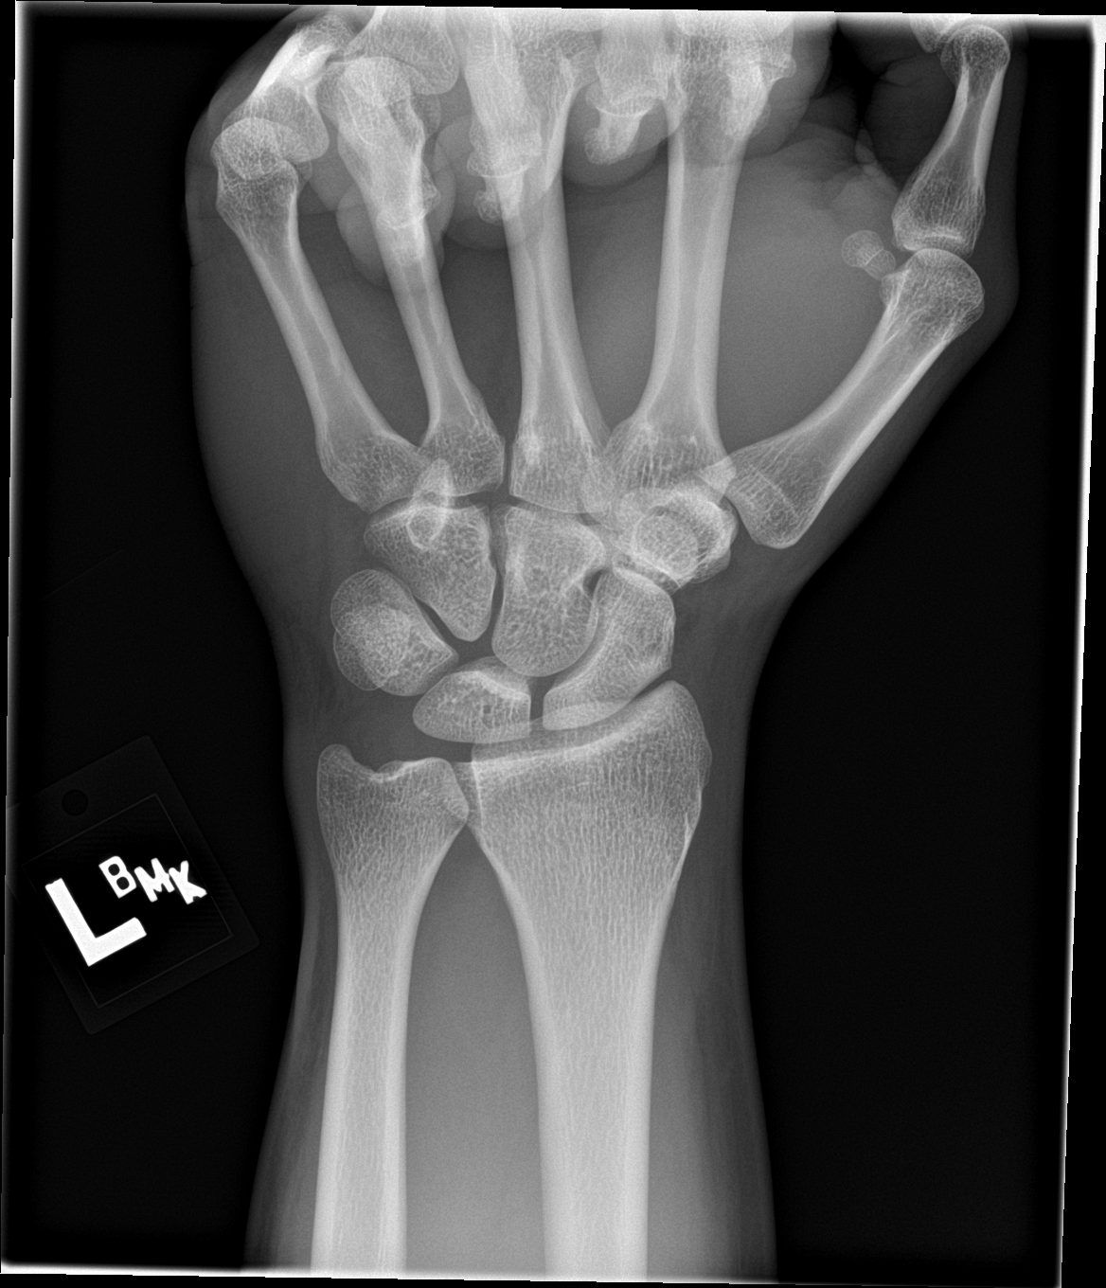

[wrist obl]
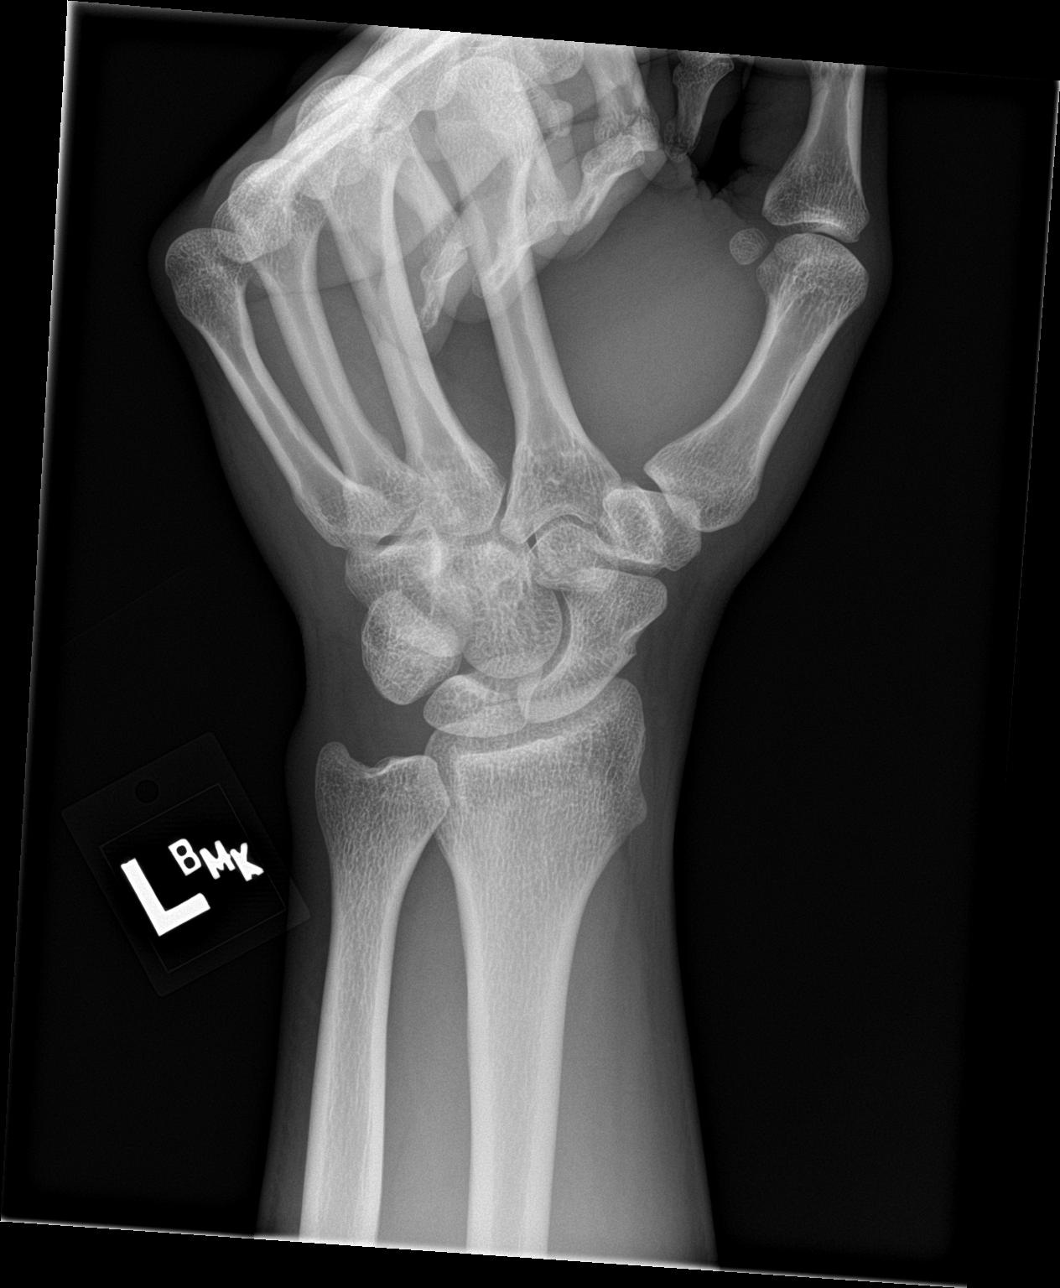

[wrist lat]
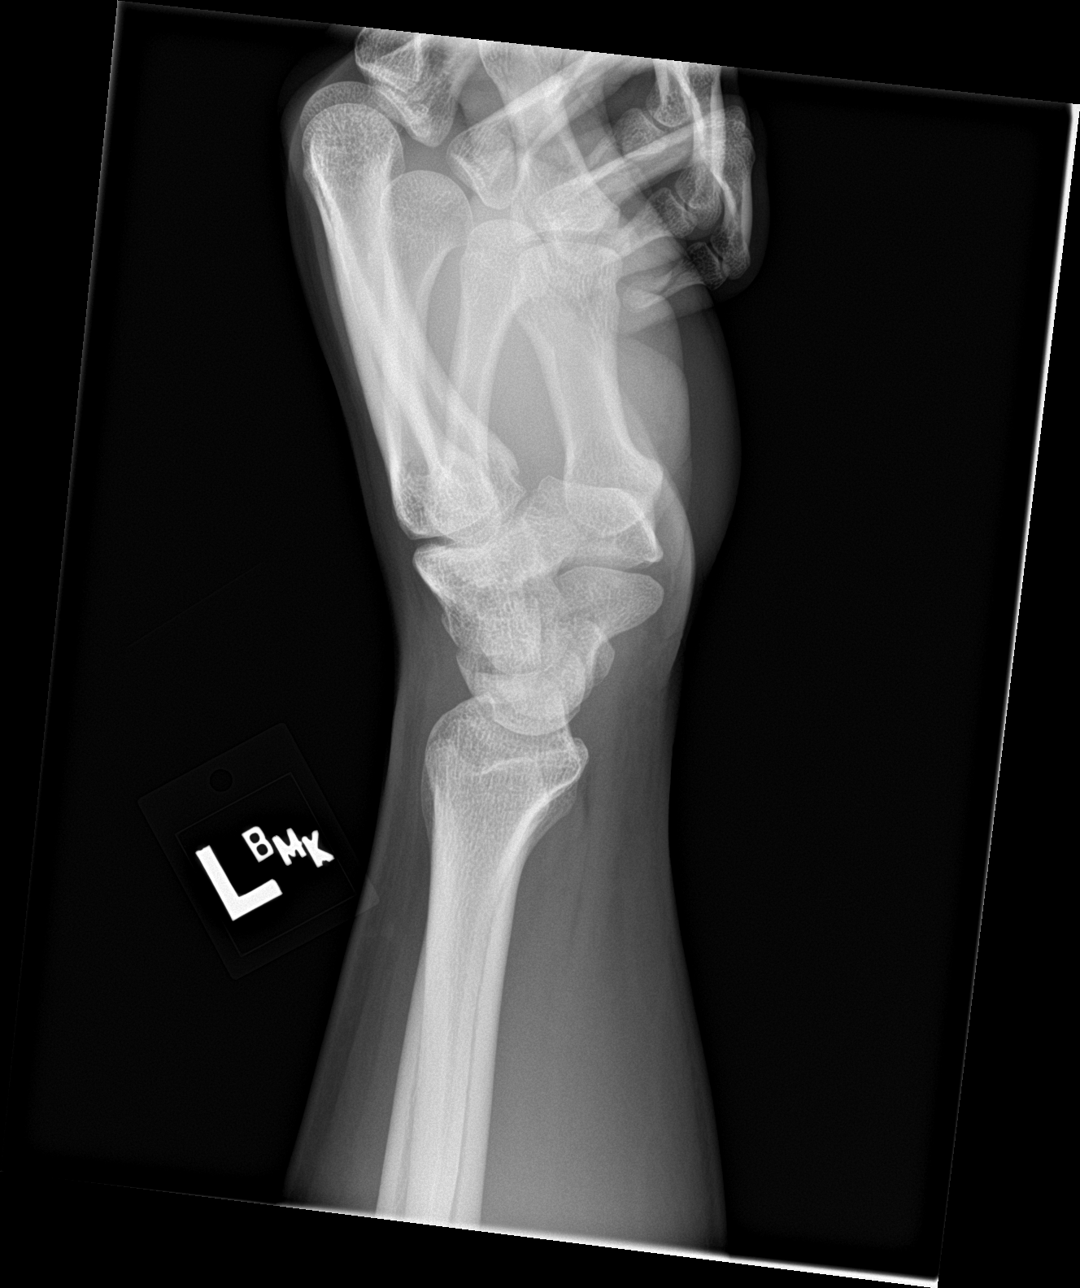

[wrist ap (2 of 2)]
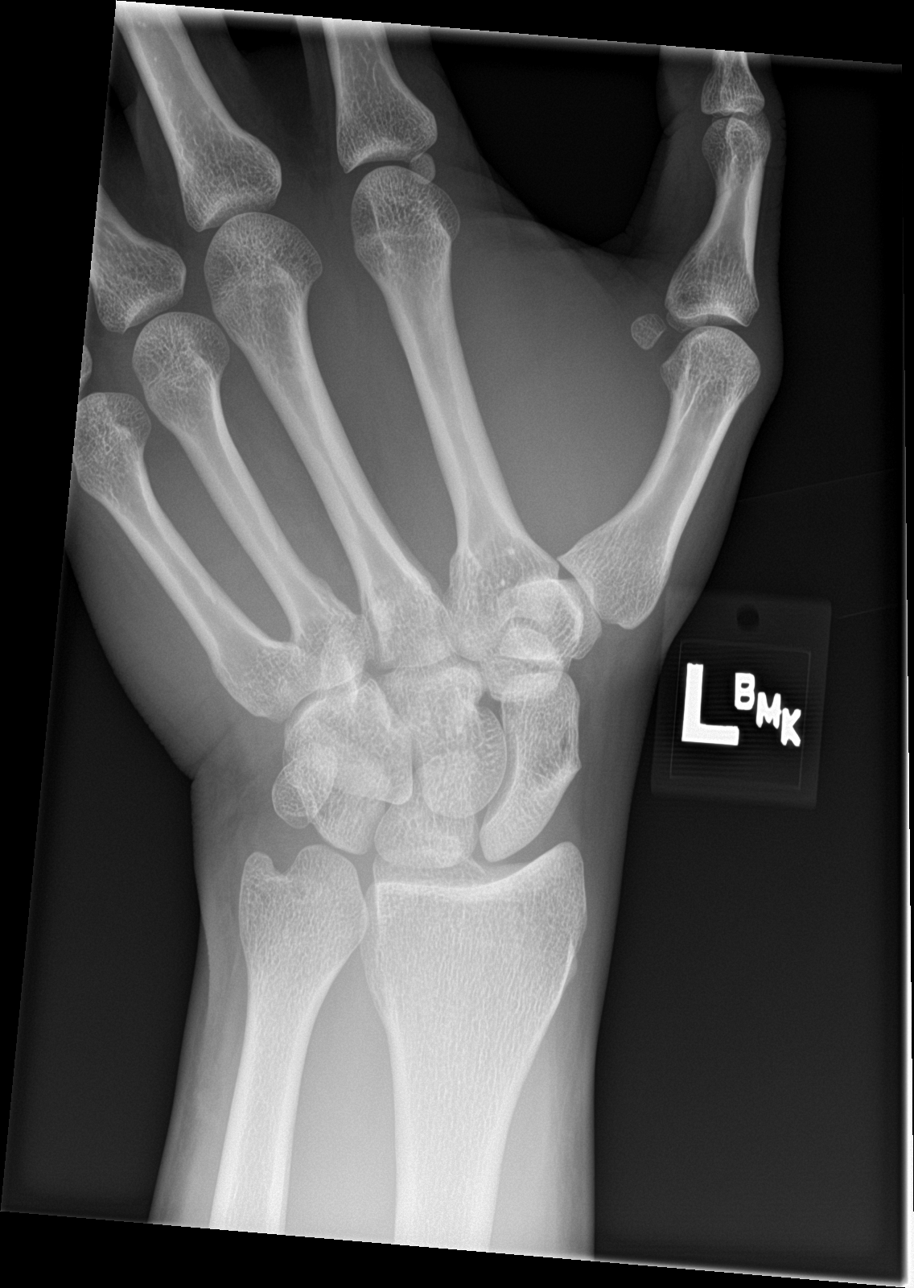

[4 of 4 positions shown; findings below may reference images not displayed]

FINDINGS: There is no evidence of fracture or dislocation. There is no
evidence of arthropathy or other focal bone abnormality. Soft
tissues are unremarkable.
IMPRESSION: Negative.
